# Patient Record
Sex: Male | Born: 1955 | Race: White | Hispanic: No | Marital: Single | Smoking: Current every day smoker
Health system: Southern US, Community
[De-identification: ages and names within clinical notes are randomized; demographics above are authoritative.]

## PROBLEM LIST (undated history)

## (undated) HISTORY — PX: OTHER SURGICAL HISTORY: SHX169

---

## 1979-04-18 HISTORY — PX: BACK SURGERY: SHX140

## 2015-11-25 ENCOUNTER — Other Ambulatory Visit: Payer: Self-pay | Admitting: Orthopedic Surgery

## 2015-12-05 ENCOUNTER — Encounter (HOSPITAL_COMMUNITY)
Admission: RE | Admit: 2015-12-05 | Discharge: 2015-12-05 | Disposition: A | Discharge status: Home / Self Care | Payer: Worker's Compensation | Source: Ambulatory Visit | Attending: Orthopedic Surgery | Admitting: Orthopedic Surgery

## 2015-12-05 ENCOUNTER — Encounter (HOSPITAL_COMMUNITY): Payer: Self-pay

## 2015-12-05 ENCOUNTER — Ambulatory Visit (HOSPITAL_COMMUNITY)
Admission: RE | Admit: 2015-12-05 | Discharge: 2015-12-05 | Disposition: A | Discharge status: Home / Self Care | Payer: Self-pay | Source: Ambulatory Visit | Attending: Orthopedic Surgery | Admitting: Orthopedic Surgery

## 2015-12-05 ENCOUNTER — Other Ambulatory Visit: Payer: Self-pay

## 2015-12-05 DIAGNOSIS — M79604 Pain in right leg: Secondary | ICD-10-CM | POA: Insufficient documentation

## 2015-12-05 DIAGNOSIS — Z01818 Encounter for other preprocedural examination: Secondary | ICD-10-CM | POA: Insufficient documentation

## 2015-12-05 DIAGNOSIS — Z01812 Encounter for preprocedural laboratory examination: Secondary | ICD-10-CM | POA: Diagnosis not present

## 2015-12-05 LAB — COMPREHENSIVE METABOLIC PANEL
ALBUMIN: 3.6 g/dL (ref 3.5–5.0)
ALK PHOS: 63 U/L (ref 38–126)
ALT: 21 U/L (ref 17–63)
ANION GAP: 11 (ref 5–15)
AST: 19 U/L (ref 15–41)
BILIRUBIN TOTAL: 0.6 mg/dL (ref 0.3–1.2)
BUN: 8 mg/dL (ref 6–20)
CALCIUM: 9.2 mg/dL (ref 8.9–10.3)
CO2: 24 mmol/L (ref 22–32)
CREATININE: 1 mg/dL (ref 0.61–1.24)
Chloride: 107 mmol/L (ref 101–111)
GFR calc Af Amer: >60 mL/min (ref >60)
GFR calc non Af Amer: >60 mL/min (ref >60)
GLUCOSE: 100 mg/dL — AB (ref 65–99)
Potassium: 4 mmol/L (ref 3.5–5.1)
SODIUM: 142 mmol/L (ref 135–145)
TOTAL PROTEIN: 6.6 g/dL (ref 6.5–8.1)

## 2015-12-05 LAB — CBC WITH DIFFERENTIAL/PLATELET
BASOS PCT: 0 %
Basophils Absolute: 0 10*3/uL (ref 0.0–0.1)
Eosinophils Absolute: 0.1 10*3/uL (ref 0.0–0.7)
Eosinophils Relative: 1 %
HEMATOCRIT: 38.4 % — AB (ref 39.0–52.0)
HEMOGLOBIN: 12.9 g/dL — AB (ref 13.0–17.0)
Lymphocytes Relative: 28 %
Lymphs Abs: 1.3 10*3/uL (ref 0.7–4.0)
MCH: 29.1 pg (ref 26.0–34.0)
MCHC: 33.6 g/dL (ref 30.0–36.0)
MCV: 86.7 fL (ref 78.0–100.0)
MONOS PCT: 11 %
Monocytes Absolute: 0.5 10*3/uL (ref 0.1–1.0)
NEUTROS ABS: 2.9 10*3/uL (ref 1.7–7.7)
NEUTROS PCT: 60 %
Platelets: 247 10*3/uL (ref 150–400)
RBC: 4.43 MIL/uL (ref 4.22–5.81)
RDW: 12.2 % (ref 11.5–15.5)
WBC: 4.9 10*3/uL (ref 4.0–10.5)

## 2015-12-05 LAB — PROTIME-INR
INR: 1.13 (ref 0.00–1.49)
Prothrombin Time: 14.7 seconds (ref 11.6–15.2)

## 2015-12-05 LAB — URINE MICROSCOPIC-ADD ON: RBC / HPF: NONE SEEN RBC/hpf (ref 0–5)

## 2015-12-05 LAB — URINALYSIS, ROUTINE W REFLEX MICROSCOPIC
BILIRUBIN URINE: NEGATIVE
GLUCOSE, UA: NEGATIVE mg/dL
HGB URINE DIPSTICK: NEGATIVE
Ketones, ur: NEGATIVE mg/dL
Nitrite: NEGATIVE
Protein, ur: NEGATIVE mg/dL
SPECIFIC GRAVITY, URINE: 1.017 (ref 1.005–1.030)
pH: 7.5 (ref 5.0–8.0)

## 2015-12-05 LAB — SURGICAL PCR SCREEN
MRSA, PCR: NEGATIVE
Staphylococcus aureus: NEGATIVE

## 2015-12-05 LAB — APTT: aPTT: 28 seconds (ref 24–37)

## 2015-12-05 NOTE — Pre-Procedure Instructions (Signed)
Jordan HoltsDavid Brooks  12/05/2015      [id number] [place], [place] - [address] [place] [id number] Phone: [phone] Fax: [phone]    Your procedure is scheduled on April 27  [facility] Admitting at [id number].M.  Call this number if you have problems the morning of surgery:  [phone]   Remember:  Do not eat food or drink liquids after midnight.  Take these medicines the morning of surgery with A SIP OF WATER Hydrocodone-acetaminophen (Norco/Vicodin) if needed  Stop taking Aspirin, BC's, Goody's, Aleve, Ibuprofen, Advil, Motrin, Aleve, Herbal medications, Fish oil- 7 days before surgery   Do not wear jewelry, make-up or nail polish.  Do not wear lotions, powders, or perfumes.  You may wear deodorant.  Do not shave 48 hours prior to surgery.  Men may shave face and neck.  Do not bring valuables to the hospital.  [facility] any belongings or valuables.  Contacts, dentures or bridgework may not be worn into surgery.  Leave your suitcase in the car.  After surgery it may be brought to your room.  For patients admitted to the hospital, discharge time will be determined by your treatment team.  Patients discharged the day of surgery will not be allowed to drive home.    Special instructions:  [place] - Preparing for Surgery  Before surgery, you can play an important role.  Because skin is not sterile, your skin needs to be as free of germs as possible.  You can reduce the number of germs on you skin by washing with CHG (chlorahexidine gluconate) soap before surgery.  CHG is an antiseptic cleaner which kills germs and bonds with the skin to continue killing germs even after washing.  Please DO NOT use if you have an allergy to CHG or antibacterial soaps.  If your skin becomes reddened/irritated stop using the CHG and inform your nurse when you arrive at Short Stay.  Do not  shave (including legs and underarms) for at least 48 hours prior to the first CHG shower.  You may shave your face.  Please follow these instructions carefully:   1.  Shower with CHG Soap the night before surgery and the                                morning of Surgery.  2.  If you choose to wash your hair, wash your hair first as usual with your       normal shampoo.  3.  After you shampoo, rinse your hair and body thoroughly to remove the                      Shampoo.  4.  Use CHG as you would any other liquid soap.  You can apply chg directly       to the skin and wash gently with scrungie or a clean washcloth.  5.  Apply the CHG Soap to your body ONLY FROM THE NECK DOWN.        Do not use on open wounds or open sores.  Avoid contact with your eyes,       ears, mouth and genitals (private parts).  Wash genitals (private parts)       with your normal soap.  6.  Wash thoroughly,  paying special attention to the area where your surgery        will be performed.  7.  Thoroughly rinse your body with warm water from the neck down.  8.  DO NOT shower/wash with your normal soap after using and rinsing off       the CHG Soap.  9.  Pat yourself dry with a clean towel.            10.  Wear clean pajamas.            11.  Place clean sheets on your bed the night of your first shower and do not        sleep with pets.  Day of Surgery  Do not apply any lotions/deoderants the morning of surgery.  Please wear clean clothes to the hospital/surgery center.     Please read over the following fact sheets that you were given. Pain Booklet, Coughing and Deep Breathing, MRSA Information and Surgical Site Infection Prevention

## 2015-12-05 NOTE — Progress Notes (Signed)
Denies having a PCP Denies ever seeing a cardiologist. Denies ever having a card cath, or echo. States he [facility]- requested report.

## 2015-12-10 NOTE — H&P (Signed)
     PREOPERATIVE H&P  Chief Complaint: R leg pain  HPI: Jordan Brooks is a 60 y.o. male who presents with ongoing pain in the right leg  MRI reveals moderate spinal stenosis L3/4  Patient has failed multiple forms of conservative care and continues to have pain (see office notes for additional details regarding the patient's full course of treatment)  No past medical history on file. Past Surgical History  Procedure Laterality Date  . Back surgery  1980's    2 times  . Skin graph to foot     Social History   Social History  . Marital Status: Single    Spouse Name: N/A  . Number of Children: N/A  . Years of Education: N/A   Social History Main Topics  . Smoking status: Current Every Day Smoker -- 0.50 packs/day for 20 years    Types: Cigarettes  . Smokeless tobacco: Not on file  . Alcohol Use: Yes     Comment: a couple on the weekend at times  . Drug Use: No  . Sexual Activity: Not on file   Other Topics Concern  . Not on file   Social History Narrative  . No narrative on file   No family history on file. Allergies  Allergen Reactions  . Silvadene [Silver Sulfadiazine] Other (See Comments)    Pt had a really bad burn on his foot - when the cream was put on it burned skin really bad. Pt is aware it could be it was because the skin was already "an open wound" and this could be why he had such bad pain with the cream   Prior to Admission medications   Medication Sig Start Date End Date Taking? Authorizing Provider  HYDROcodone-acetaminophen (NORCO/VICODIN) 5-325 MG tablet Take 1 tablet by mouth every 4 (four) hours as needed for moderate pain.   Yes Historical Provider, MD  naproxen sodium (ALEVE) 220 MG tablet Take 440 mg by mouth every 4 (four) hours as needed.   Yes Historical Provider, MD     All other systems have been reviewed and were otherwise negative with the exception of those mentioned in the HPI and as above.  Physical Exam: There were no vitals  filed for this visit.  General: Alert, no acute distress Cardiovascular: No pedal edema Respiratory: No cyanosis, no use of accessory musculature Skin: No lesions in the area of chief complaint Neurologic: Sensation intact distally Psychiatric: Patient is competent for consent with normal mood and affect Lymphatic: No axillary or cervical lymphadenopathy  MUSCULOSKELETAL: + R SLR  Assessment/Plan: Right leg pain   Plan for Procedure(s): LUMBAR 3-4 DECOMPRESSION    Emilee HeroUMONSKI,Sonja Manseau LEONARD, MD 12/10/2015 3:27 PM

## 2015-12-11 MED ORDER — DEXTROSE 5 % IV SOLN
3.0000 g | INTRAVENOUS | Status: AC
  Administered 2015-12-12: 3 g via INTRAVENOUS
  Filled 2015-12-11: qty 3000

## 2015-12-12 ENCOUNTER — Ambulatory Visit (HOSPITAL_COMMUNITY)
Admission: RE | Admit: 2015-12-12 | Discharge: 2015-12-12 | Disposition: A | Discharge status: Home / Self Care | Payer: Worker's Compensation | Source: Ambulatory Visit | Attending: Orthopedic Surgery | Admitting: Orthopedic Surgery

## 2015-12-12 ENCOUNTER — Ambulatory Visit (HOSPITAL_COMMUNITY): Payer: Worker's Compensation

## 2015-12-12 ENCOUNTER — Ambulatory Visit (HOSPITAL_COMMUNITY): Payer: Worker's Compensation | Admitting: Anesthesiology

## 2015-12-12 ENCOUNTER — Encounter (HOSPITAL_COMMUNITY)
Admission: RE | Disposition: A | Discharge status: Home / Self Care | Payer: Self-pay | Source: Ambulatory Visit | Attending: Orthopedic Surgery

## 2015-12-12 ENCOUNTER — Encounter (HOSPITAL_COMMUNITY): Payer: Self-pay | Admitting: Anesthesiology

## 2015-12-12 DIAGNOSIS — F17210 Nicotine dependence, cigarettes, uncomplicated: Secondary | ICD-10-CM | POA: Insufficient documentation

## 2015-12-12 DIAGNOSIS — M4806 Spinal stenosis, lumbar region: Secondary | ICD-10-CM | POA: Insufficient documentation

## 2015-12-12 DIAGNOSIS — M5416 Radiculopathy, lumbar region: Secondary | ICD-10-CM | POA: Diagnosis not present

## 2015-12-12 DIAGNOSIS — Z419 Encounter for procedure for purposes other than remedying health state, unspecified: Secondary | ICD-10-CM

## 2015-12-12 HISTORY — PX: LUMBAR LAMINECTOMY/DECOMPRESSION MICRODISCECTOMY: SHX5026

## 2015-12-12 SURGERY — LUMBAR LAMINECTOMY/DECOMPRESSION MICRODISCECTOMY
Anesthesia: General | Site: Back

## 2015-12-12 MED ORDER — PHENYLEPHRINE 40 MCG/ML (10ML) SYRINGE FOR IV PUSH (FOR BLOOD PRESSURE SUPPORT)
PREFILLED_SYRINGE | INTRAVENOUS | Status: AC
  Filled 2015-12-12: qty 10

## 2015-12-12 MED ORDER — ONDANSETRON HCL 4 MG/2ML IJ SOLN
INTRAMUSCULAR | Status: AC
  Filled 2015-12-12: qty 2

## 2015-12-12 MED ORDER — ROCURONIUM BROMIDE 100 MG/10ML IV SOLN
INTRAVENOUS | Status: DC | PRN
  Administered 2015-12-12: 50 mg via INTRAVENOUS
  Administered 2015-12-12: 10 mg via INTRAVENOUS
  Administered 2015-12-12: 5 mg via INTRAVENOUS

## 2015-12-12 MED ORDER — METHYLENE BLUE 0.5 % INJ SOLN
INTRAVENOUS | Status: DC | PRN
  Administered 2015-12-12: 1 mL

## 2015-12-12 MED ORDER — THROMBIN 20000 UNITS EX SOLR
CUTANEOUS | Status: AC
  Filled 2015-12-12: qty 20000

## 2015-12-12 MED ORDER — GLYCOPYRROLATE 0.2 MG/ML IJ SOLN
INTRAMUSCULAR | Status: DC | PRN
  Administered 2015-12-12: 0.4 mg via INTRAVENOUS

## 2015-12-12 MED ORDER — SUCCINYLCHOLINE CHLORIDE 20 MG/ML IJ SOLN
INTRAMUSCULAR | Status: DC | PRN
  Administered 2015-12-12: 140 mg via INTRAVENOUS

## 2015-12-12 MED ORDER — MIDAZOLAM HCL 2 MG/2ML IJ SOLN
INTRAMUSCULAR | Status: AC
  Filled 2015-12-12: qty 2

## 2015-12-12 MED ORDER — THROMBIN 20000 UNITS EX SOLR
CUTANEOUS | Status: DC | PRN
  Administered 2015-12-12: 20 mL via TOPICAL

## 2015-12-12 MED ORDER — OXYCODONE-ACETAMINOPHEN 5-325 MG PO TABS
1.0000 | ORAL_TABLET | Freq: Once | ORAL | Status: AC
  Administered 2015-12-12: 1 via ORAL

## 2015-12-12 MED ORDER — PROPOFOL 10 MG/ML IV BOLUS
INTRAVENOUS | Status: DC | PRN
  Administered 2015-12-12: 250 mg via INTRAVENOUS

## 2015-12-12 MED ORDER — PROPOFOL 10 MG/ML IV BOLUS
INTRAVENOUS | Status: AC
  Filled 2015-12-12: qty 40

## 2015-12-12 MED ORDER — HYDROMORPHONE HCL 1 MG/ML IJ SOLN
INTRAMUSCULAR | Status: AC
  Filled 2015-12-12: qty 1

## 2015-12-12 MED ORDER — BUPIVACAINE-EPINEPHRINE 0.25% -1:200000 IJ SOLN
INTRAMUSCULAR | Status: DC | PRN
  Administered 2015-12-12: 5 mL

## 2015-12-12 MED ORDER — POVIDONE-IODINE 7.5 % EX SOLN
Freq: Once | CUTANEOUS | Status: DC
  Filled 2015-12-12: qty 118

## 2015-12-12 MED ORDER — ONDANSETRON HCL 4 MG/2ML IJ SOLN
INTRAMUSCULAR | Status: DC | PRN
  Administered 2015-12-12: 4 mg via INTRAVENOUS

## 2015-12-12 MED ORDER — NEOSTIGMINE METHYLSULFATE 10 MG/10ML IV SOLN
INTRAVENOUS | Status: DC | PRN
  Administered 2015-12-12: 3 mg via INTRAVENOUS

## 2015-12-12 MED ORDER — HYDROMORPHONE HCL 1 MG/ML IJ SOLN
0.2500 mg | INTRAMUSCULAR | Status: DC | PRN
  Administered 2015-12-12: 0.25 mg via INTRAVENOUS
  Administered 2015-12-12: .2 mg via INTRAVENOUS
  Administered 2015-12-12: 0.5 mg via INTRAVENOUS
  Administered 2015-12-12: 0.25 mg via INTRAVENOUS

## 2015-12-12 MED ORDER — DIAZEPAM 5 MG PO TABS
5.0000 mg | ORAL_TABLET | Freq: Once | ORAL | Status: AC
  Administered 2015-12-12: 5 mg via ORAL

## 2015-12-12 MED ORDER — 0.9 % SODIUM CHLORIDE (POUR BTL) OPTIME
TOPICAL | Status: DC | PRN
  Administered 2015-12-12 (×2): 1000 mL

## 2015-12-12 MED ORDER — ARTIFICIAL TEARS OP OINT
TOPICAL_OINTMENT | OPHTHALMIC | Status: AC
  Filled 2015-12-12: qty 3.5

## 2015-12-12 MED ORDER — SUCCINYLCHOLINE CHLORIDE 20 MG/ML IJ SOLN
INTRAMUSCULAR | Status: AC
  Filled 2015-12-12: qty 1

## 2015-12-12 MED ORDER — LACTATED RINGERS IV SOLN
INTRAVENOUS | Status: DC
  Administered 2015-12-12 (×3): via INTRAVENOUS

## 2015-12-12 MED ORDER — METHYLPREDNISOLONE ACETATE 40 MG/ML IJ SUSP
INTRAMUSCULAR | Status: AC
  Filled 2015-12-12: qty 1

## 2015-12-12 MED ORDER — OXYCODONE-ACETAMINOPHEN 5-325 MG PO TABS
ORAL_TABLET | ORAL | Status: AC
  Filled 2015-12-12: qty 1

## 2015-12-12 MED ORDER — GLYCOPYRROLATE 0.2 MG/ML IJ SOLN
INTRAMUSCULAR | Status: AC
  Filled 2015-12-12: qty 4

## 2015-12-12 MED ORDER — FENTANYL CITRATE (PF) 250 MCG/5ML IJ SOLN
INTRAMUSCULAR | Status: AC
  Filled 2015-12-12: qty 5

## 2015-12-12 MED ORDER — METHYLPREDNISOLONE ACETATE 40 MG/ML IJ SUSP
INTRAMUSCULAR | Status: DC | PRN
  Administered 2015-12-12: 40 mg

## 2015-12-12 MED ORDER — METHYLENE BLUE 0.5 % INJ SOLN
INTRAVENOUS | Status: AC
  Filled 2015-12-12: qty 10

## 2015-12-12 MED ORDER — LIDOCAINE HCL (CARDIAC) 20 MG/ML IV SOLN
INTRAVENOUS | Status: AC
  Filled 2015-12-12: qty 5

## 2015-12-12 MED ORDER — DIAZEPAM 5 MG PO TABS
ORAL_TABLET | ORAL | Status: AC
  Filled 2015-12-12: qty 1

## 2015-12-12 MED ORDER — PHENYLEPHRINE HCL 10 MG/ML IJ SOLN
INTRAMUSCULAR | Status: DC | PRN
  Administered 2015-12-12 (×2): 40 ug via INTRAVENOUS

## 2015-12-12 MED ORDER — BUPIVACAINE-EPINEPHRINE (PF) 0.25% -1:200000 IJ SOLN
INTRAMUSCULAR | Status: AC
  Filled 2015-12-12: qty 30

## 2015-12-12 MED ORDER — LIDOCAINE HCL (CARDIAC) 20 MG/ML IV SOLN
INTRAVENOUS | Status: DC | PRN
  Administered 2015-12-12: 70 mg via INTRAVENOUS

## 2015-12-12 MED ORDER — MIDAZOLAM HCL 5 MG/5ML IJ SOLN
INTRAMUSCULAR | Status: DC | PRN
  Administered 2015-12-12 (×2): 1 mg via INTRAVENOUS

## 2015-12-12 MED ORDER — ROCURONIUM BROMIDE 50 MG/5ML IV SOLN
INTRAVENOUS | Status: AC
  Filled 2015-12-12: qty 2

## 2015-12-12 MED ORDER — FENTANYL CITRATE (PF) 100 MCG/2ML IJ SOLN
INTRAMUSCULAR | Status: DC | PRN
  Administered 2015-12-12: 100 ug via INTRAVENOUS
  Administered 2015-12-12 (×3): 50 ug via INTRAVENOUS

## 2015-12-12 MED ORDER — HEMOSTATIC AGENTS (NO CHARGE) OPTIME
TOPICAL | Status: DC | PRN
  Administered 2015-12-12: 1 via TOPICAL

## 2015-12-12 SURGICAL SUPPLY — 76 items
BENZOIN TINCTURE PRP APPL 2/3 (GAUZE/BANDAGES/DRESSINGS) ×3 IMPLANT
BUR ROUND PRECISION 4.0 (BURR) ×2 IMPLANT
BUR ROUND PRECISION 4.0MM (BURR) ×1
CANISTER SUCTION 2500CC (MISCELLANEOUS) ×3 IMPLANT
CARTRIDGE OIL MAESTRO DRILL (MISCELLANEOUS) ×1 IMPLANT
CLOSURE WOUND 1/2 X4 (GAUZE/BANDAGES/DRESSINGS)
CORDS BIPOLAR (ELECTRODE) ×3 IMPLANT
COVER SURGICAL LIGHT HANDLE (MISCELLANEOUS) ×3 IMPLANT
DIFFUSER DRILL AIR PNEUMATIC (MISCELLANEOUS) ×3 IMPLANT
DRAIN CHANNEL 15F RND FF W/TCR (WOUND CARE) IMPLANT
DRAPE POUCH INSTRU U-SHP 10X18 (DRAPES) ×6 IMPLANT
DRAPE SURG 17X23 STRL (DRAPES) ×12 IMPLANT
DURAPREP 26ML APPLICATOR (WOUND CARE) ×3 IMPLANT
ELECT BLADE 4.0 EZ CLEAN MEGAD (MISCELLANEOUS) ×3
ELECT CAUTERY BLADE 6.4 (BLADE) ×3 IMPLANT
ELECT REM PT RETURN 9FT ADLT (ELECTROSURGICAL) ×3
ELECTRODE BLDE 4.0 EZ CLN MEGD (MISCELLANEOUS) ×1 IMPLANT
ELECTRODE REM PT RTRN 9FT ADLT (ELECTROSURGICAL) ×1 IMPLANT
EVACUATOR SILICONE 100CC (DRAIN) IMPLANT
FILTER STRAW FLUID ASPIR (MISCELLANEOUS) ×3 IMPLANT
GAUZE SPONGE 4X4 12PLY STRL (GAUZE/BANDAGES/DRESSINGS) ×3 IMPLANT
GAUZE SPONGE 4X4 16PLY XRAY LF (GAUZE/BANDAGES/DRESSINGS) ×6 IMPLANT
GLOVE BIO SURGEON STRL SZ 6.5 (GLOVE) ×2 IMPLANT
GLOVE BIO SURGEON STRL SZ7 (GLOVE) ×9 IMPLANT
GLOVE BIO SURGEON STRL SZ8 (GLOVE) ×6 IMPLANT
GLOVE BIO SURGEONS STRL SZ 6.5 (GLOVE) ×1
GLOVE BIOGEL PI IND STRL 6.5 (GLOVE) ×1 IMPLANT
GLOVE BIOGEL PI IND STRL 7.0 (GLOVE) ×2 IMPLANT
GLOVE BIOGEL PI IND STRL 8 (GLOVE) ×2 IMPLANT
GLOVE BIOGEL PI INDICATOR 6.5 (GLOVE) ×2
GLOVE BIOGEL PI INDICATOR 7.0 (GLOVE) ×4
GLOVE BIOGEL PI INDICATOR 8 (GLOVE) ×4
GLOVE ORTHOPEDIC STR SZ6.5 (GLOVE) ×3 IMPLANT
GOWN STRL REUS W/ TWL LRG LVL3 (GOWN DISPOSABLE) ×1 IMPLANT
GOWN STRL REUS W/ TWL XL LVL3 (GOWN DISPOSABLE) ×2 IMPLANT
GOWN STRL REUS W/TWL LRG LVL3 (GOWN DISPOSABLE) ×2
GOWN STRL REUS W/TWL XL LVL3 (GOWN DISPOSABLE) ×4
IV CATH 14GX2 1/4 (CATHETERS) ×3 IMPLANT
KIT BASIN OR (CUSTOM PROCEDURE TRAY) ×3 IMPLANT
KIT POSITION SURG JACKSON T1 (MISCELLANEOUS) ×6 IMPLANT
KIT ROOM TURNOVER OR (KITS) ×3 IMPLANT
NEEDLE 18GX1X1/2 (RX/OR ONLY) (NEEDLE) ×3 IMPLANT
NEEDLE 22X1 1/2 (OR ONLY) (NEEDLE) ×3 IMPLANT
NEEDLE HYPO 25GX1X1/2 BEV (NEEDLE) ×3 IMPLANT
NEEDLE SPNL 18GX3.5 QUINCKE PK (NEEDLE) ×6 IMPLANT
NS IRRIG 1000ML POUR BTL (IV SOLUTION) ×3 IMPLANT
OIL CARTRIDGE MAESTRO DRILL (MISCELLANEOUS) ×3
PACK LAMINECTOMY ORTHO (CUSTOM PROCEDURE TRAY) ×3 IMPLANT
PACK UNIVERSAL I (CUSTOM PROCEDURE TRAY) ×3 IMPLANT
PAD ARMBOARD 7.5X6 YLW CONV (MISCELLANEOUS) ×6 IMPLANT
PATTIES SURGICAL .5 X.5 (GAUZE/BANDAGES/DRESSINGS) IMPLANT
PATTIES SURGICAL .5 X1 (DISPOSABLE) ×3 IMPLANT
SPONGE GAUZE 4X4 12PLY STER LF (GAUZE/BANDAGES/DRESSINGS) ×3 IMPLANT
SPONGE INTESTINAL PEANUT (DISPOSABLE) ×3 IMPLANT
SPONGE SURGIFOAM ABS GEL 100 (HEMOSTASIS) ×3 IMPLANT
SPONGE SURGIFOAM ABS GEL SZ50 (HEMOSTASIS) ×3 IMPLANT
STRIP CLOSURE SKIN 1/2X4 (GAUZE/BANDAGES/DRESSINGS) IMPLANT
SURGIFLO W/THROMBIN 8M KIT (HEMOSTASIS) ×3 IMPLANT
SUT MNCRL AB 4-0 PS2 18 (SUTURE) ×3 IMPLANT
SUT VIC AB 0 CT1 18XCR BRD 8 (SUTURE) IMPLANT
SUT VIC AB 0 CT1 27 (SUTURE)
SUT VIC AB 0 CT1 27XBRD ANBCTR (SUTURE) IMPLANT
SUT VIC AB 0 CT1 8-18 (SUTURE)
SUT VIC AB 1 CT1 18XCR BRD 8 (SUTURE) ×1 IMPLANT
SUT VIC AB 1 CT1 8-18 (SUTURE) ×2
SUT VIC AB 2-0 CT2 18 VCP726D (SUTURE) ×3 IMPLANT
SYR 20CC LL (SYRINGE) ×3 IMPLANT
SYR BULB IRRIGATION 50ML (SYRINGE) ×3 IMPLANT
SYR CONTROL 10ML LL (SYRINGE) ×6 IMPLANT
SYR TB 1ML 26GX3/8 SAFETY (SYRINGE) ×9 IMPLANT
SYR TB 1ML LUER SLIP (SYRINGE) ×6 IMPLANT
TAPE CLOTH SURG 4X10 WHT LF (GAUZE/BANDAGES/DRESSINGS) ×3 IMPLANT
TOWEL OR 17X24 6PK STRL BLUE (TOWEL DISPOSABLE) ×3 IMPLANT
TOWEL OR 17X26 10 PK STRL BLUE (TOWEL DISPOSABLE) ×3 IMPLANT
WATER STERILE IRR 1000ML POUR (IV SOLUTION) ×3 IMPLANT
YANKAUER SUCT BULB TIP NO VENT (SUCTIONS) ×3 IMPLANT

## 2015-12-12 NOTE — Anesthesia Procedure Notes (Signed)
Procedure Name: Intubation Date/Time: 12/12/2015 2:39 PM Performed by: Marni GriffonJAMES, Margi Edmundson B Pre-anesthesia Checklist: Patient identified, Emergency Drugs available, Suction available and Patient being monitored Patient Re-evaluated:Patient Re-evaluated prior to inductionPreoxygenation: Pre-oxygenation with 100% oxygen Intubation Type: IV induction Ventilation: Mask ventilation with difficulty Laryngoscope Size: Mac and 4 Grade View: Grade I Tube type: Oral Tube size: 7.5 mm Number of attempts: 1 Airway Equipment and Method: Stylet Placement Confirmation: ETT inserted through vocal cords under direct vision,  positive ETCO2 and breath sounds checked- equal and bilateral Secured at: 23 (cm at teeth) cm Tube secured with: Tape Dental Injury: Teeth and Oropharynx as per pre-operative assessment  Comments: Mustache and goatee

## 2015-12-12 NOTE — Transfer of Care (Signed)
Immediate Anesthesia Transfer of Care Note  Patient: Festus HoltsDavid Herzig  Procedure(s) Performed: Procedure(s): LUMBAR 3-4 DECOMPRESSION  (N/A)  Patient Location: PACU  Anesthesia Type:General  Level of Consciousness: awake, sedated and patient cooperative  Airway & Oxygen Therapy: Patient Spontanous Breathing and Patient connected to nasal cannula oxygen  Post-op Assessment: Report given to RN, Post -op Vital signs reviewed and stable and Patient moving all extremities  Post vital signs: Reviewed and stable  Last Vitals:  Filed Vitals:   12/12/15 1040  BP: 144/81  Pulse: 76  Temp: 36.9 C  Resp: 20    Last Pain:  Filed Vitals:   12/12/15 1819  PainSc: 3       Patients Stated Pain Goal: 3 (12/12/15 1056)  Complications: No apparent anesthesia complications

## 2015-12-12 NOTE — Anesthesia Preprocedure Evaluation (Addendum)
Anesthesia Evaluation  Patient identified by MRN, date of birth, ID band Patient awake    Reviewed: Allergy & Precautions, NPO status , Patient's Chart, lab work & pertinent test results  Airway Mallampati: II  TM Distance: >3 FB Neck ROM: Full    Dental  (+) Teeth Intact, Dental Advisory Given   Pulmonary Current Smoker,    breath sounds clear to auscultation       Cardiovascular negative cardio ROS   Rhythm:Regular Rate:Normal     Neuro/Psych    GI/Hepatic negative GI ROS, Neg liver ROS,   Endo/Other  negative endocrine ROS  Renal/GU negative Renal ROS     Musculoskeletal   Abdominal   Peds  Hematology   Anesthesia Other Findings Mustache and Goatee  Reproductive/Obstetrics                            Anesthesia Physical Anesthesia Plan  ASA: II  Anesthesia Plan: General   Post-op Pain Management:    Induction: Intravenous  Airway Management Planned: Oral ETT  Additional Equipment:   Intra-op Plan:   Post-operative Plan: Extubation in OR  Informed Consent: I have reviewed the patients History and Physical, chart, labs and discussed the procedure including the risks, benefits and alternatives for the proposed anesthesia with the patient or authorized representative who has indicated his/her understanding and acceptance.   Dental advisory given  Plan Discussed with: CRNA and Anesthesiologist  Anesthesia Plan Comments:         Anesthesia Quick Evaluation

## 2015-12-12 NOTE — Progress Notes (Signed)
Due to area of incision and positioning during surgery, sacral foam prophylaxis not applied.

## 2015-12-13 ENCOUNTER — Encounter (HOSPITAL_COMMUNITY): Payer: Self-pay | Admitting: Orthopedic Surgery

## 2015-12-13 NOTE — Op Note (Signed)
NAME:  Jordan Brooks, Jordan Brooks NO.:  [id number]  MEDICAL RECORD NO.:  [id number]  LOCATION:  [facility]                         FACILITY:  [facility]  PHYSICIAN:  Estill Bamberg, MD      DATE OF BIRTH:  Apr 10, 1956  DATE OF PROCEDURE:  12/12/2015 DATE OF DISCHARGE:  12/12/2015                              OPERATIVE REPORT   PREOPERATIVE DIAGNOSES: 1. Right-sided lumbar radiculopathy. 2. L3-4 spinal stenosis. 3. Status post previous L4-5 decompression.  POSTOPERATIVE DIAGNOSES: 1. Right-sided lumbar radiculopathy. 2. L3-4 spinal stenosis. 3. Status post previous L4-5 decompression.  PROCEDURES:  L3-4 laminectomy with bilateral partial facetectomy and bilateral foraminotomy.  SURGEON:  Estill Bamberg, MD  ASSISTANT:  Jason Coop, PA-C.  ANESTHESIA:  General endotracheal anesthesia.  COMPLICATIONS:  None.  DISPOSITION:  Stable.  ESTIMATED BLOOD LOSS:  Minimal.  INDICATIONS FOR SURGERY:  Briefly, Jordan Brooks is a 60 year old male, who did initially present to me with pain in both the back and bilateral legs.  This did progress into right leg pain only.  The patient did have an MRI notable for stenosis to a moderate degree at L4-5.  Of note, the patient is status post an L4-5 decompression.  Per the patient, he did have a previous fusion at L4-5.  However, I did obtain a CAT scan, which did reveal that he did not in fact have a fusion at L4-5, but did have a central and bilateral decompression at L4-5.  Given the patient's ongoing symptoms, we did discuss proceeding with a decompressive procedure as noted above.  The patient did elect to proceed.  OPERATIVE DETAILS:  On December 12, 2015, the patient was brought to Surgery and general endotracheal anesthesia was administered.  The patient was placed prone on a well-padded flat Jackson bed with a spinal frame.  Antibiotics were given and a time-out procedure was performed. The back was prepped and draped and a midline  incision was made.  Of note, the patient did have a very lengthy and extensive scar from his previous L4-5 decompression.  The length of the scar was a bit curious, as he was noted to only have had a decompression at L4-5.  I was, however, able to identify the L3 spinous process and the lamina of L3 and L4.  A lateral radiograph did confirm the appropriate operative level.  Of note, the dissection was very meticulous, as there was abundant scar tissue about the posterior elements surrounding the L4-5 intervertebral space.  It does appear that the patient's previous surgery did extend into the L3-4 region.  The facet joints were also noted to be very degenerated and irregular.  It did appear to me that there was a previous attempt at a L3-4 fusion in addition to an L4-5 fusion, given the very abnormal architecture of the facet joints at L3- 4.  In any event, I was able to expose the epidural space and posterior elements at L3 and at L4.  A self-retaining retractor was placed.  I then removed the spinous process of L3 and I proceeded with a central and bilateral lateral recess decompression.  Again, as previously noted, there was noted to be scar tissue about  the epidural space and along the posterior elements.  This did make the exposure and the decompression rather meticulous, however, I was able to adequately decompress the L3-4 region on the left and right sides, and a bilateral neuroforaminal decompression was also performed and confirmed using a Woodson.  I was very pleased with the decompression that I was able to accomplish.  The wound was then copiously irrigated.  Bleeding was controlled using Surgiflo as well as bipolar electrocautery.  40 mg of Depo-Medrol was then introduced about the epidural space.  The wound was then closed in layers using #1 Vicryl followed by 2-0 Vicryl, followed by 3-0 Monocryl. Benzoin and Steri-Strips were applied followed by sterile dressing.   All instrument counts were correct at the termination of the procedure.  Of note, Jason CoopKayla McKenzie was my assistant throughout the surgery and did aid in retraction, suctioning, and closure from start to finish.     Estill BambergMark Margeret Stachnik, MD     MD/MEDQ  D:  12/12/2015  T:  12/13/2015  Job:  [id number]

## 2015-12-13 NOTE — Anesthesia Postprocedure Evaluation (Signed)
Anesthesia Post Note  Patient: Jordan Brooks  Procedure(s) Performed: Procedure(s) (LRB): LUMBAR 3-4 DECOMPRESSION  (N/A)  Patient location during evaluation: PACU Anesthesia Type: General Level of consciousness: awake and alert and patient cooperative Pain management: pain level controlled Vital Signs Assessment: post-procedure vital signs reviewed and stable Respiratory status: spontaneous breathing and respiratory function stable Cardiovascular status: stable Anesthetic complications: no    Last Vitals:  Filed Vitals:   12/12/15 1847 12/12/15 1858  BP: 127/84 130/74  Pulse: 73   Temp:    Resp: 18     Last Pain:  Filed Vitals:   12/12/15 1859  PainSc: 4                  Diann Bangerter S
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
